# Patient Record
Sex: Female | Born: 1985 | Race: Black or African American | Hispanic: No | Marital: Single | Smoking: Never smoker
Health system: Southern US, Community
[De-identification: ages and names within clinical notes are randomized; demographics above are authoritative.]

## PROBLEM LIST (undated history)

## (undated) HISTORY — PX: APPENDECTOMY: SHX54

---

## 2018-05-25 ENCOUNTER — Other Ambulatory Visit: Payer: Self-pay

## 2018-05-25 ENCOUNTER — Emergency Department (HOSPITAL_COMMUNITY): Payer: Medicare (Managed Care)

## 2018-05-25 ENCOUNTER — Emergency Department (HOSPITAL_COMMUNITY)
Admission: EM | Admit: 2018-05-25 | Discharge: 2018-05-25 | Disposition: A | Discharge status: Home / Self Care | Payer: Medicare (Managed Care) | Attending: Emergency Medicine | Admitting: Emergency Medicine

## 2018-05-25 ENCOUNTER — Encounter (HOSPITAL_COMMUNITY): Payer: Self-pay | Admitting: Emergency Medicine

## 2018-05-25 DIAGNOSIS — R1013 Epigastric pain: Secondary | ICD-10-CM | POA: Diagnosis present

## 2018-05-25 DIAGNOSIS — K529 Noninfective gastroenteritis and colitis, unspecified: Secondary | ICD-10-CM | POA: Diagnosis not present

## 2018-05-25 LAB — CBC WITH DIFFERENTIAL/PLATELET
Abs Immature Granulocytes: 0.03 10*3/uL (ref 0.00–0.07)
Basophils Absolute: 0.1 10*3/uL (ref 0.0–0.1)
Basophils Relative: 1 %
Eosinophils Absolute: 0.1 10*3/uL (ref 0.0–0.5)
Eosinophils Relative: 1 %
HCT: 40.6 % (ref 36.0–46.0)
Hemoglobin: 13.6 g/dL (ref 12.0–15.0)
Immature Granulocytes: 0 %
Lymphocytes Relative: 22 %
Lymphs Abs: 2.3 10*3/uL (ref 0.7–4.0)
MCH: 32.8 pg (ref 26.0–34.0)
MCHC: 33.5 g/dL (ref 30.0–36.0)
MCV: 97.8 fL (ref 80.0–100.0)
Monocytes Absolute: 0.6 10*3/uL (ref 0.1–1.0)
Monocytes Relative: 6 %
Neutro Abs: 7.5 10*3/uL (ref 1.7–7.7)
Neutrophils Relative %: 70 %
Platelets: 322 10*3/uL (ref 150–400)
RBC: 4.15 MIL/uL (ref 3.87–5.11)
RDW: 12.4 % (ref 11.5–15.5)
WBC: 10.6 10*3/uL — ABNORMAL HIGH (ref 4.0–10.5)
nRBC: 0 % (ref 0.0–0.2)

## 2018-05-25 LAB — URINALYSIS, ROUTINE W REFLEX MICROSCOPIC
Bilirubin Urine: NEGATIVE
Glucose, UA: NEGATIVE mg/dL
Hgb urine dipstick: NEGATIVE
Ketones, ur: 20 mg/dL — AB
Nitrite: NEGATIVE
Protein, ur: NEGATIVE mg/dL
Specific Gravity, Urine: 1.023 (ref 1.005–1.030)
pH: 5 (ref 5.0–8.0)

## 2018-05-25 LAB — POC URINE PREG, ED: Preg Test, Ur: NEGATIVE

## 2018-05-25 LAB — COMPREHENSIVE METABOLIC PANEL
ALT: 18 U/L (ref 0–44)
AST: 20 U/L (ref 15–41)
Albumin: 4 g/dL (ref 3.5–5.0)
Alkaline Phosphatase: 63 U/L (ref 38–126)
Anion gap: 10 (ref 5–15)
BUN: 11 mg/dL (ref 6–20)
CO2: 21 mmol/L — ABNORMAL LOW (ref 22–32)
Calcium: 9 mg/dL (ref 8.9–10.3)
Chloride: 106 mmol/L (ref 98–111)
Creatinine, Ser: 0.98 mg/dL (ref 0.44–1.00)
GFR calc Af Amer: >60 mL/min (ref >60)
GFR calc non Af Amer: >60 mL/min (ref >60)
Glucose, Bld: 104 mg/dL — ABNORMAL HIGH (ref 70–99)
Potassium: 3.6 mmol/L (ref 3.5–5.1)
Sodium: 137 mmol/L (ref 135–145)
Total Bilirubin: 0.4 mg/dL (ref 0.3–1.2)
Total Protein: 7.2 g/dL (ref 6.5–8.1)

## 2018-05-25 LAB — LIPASE, BLOOD: Lipase: 21 U/L (ref 11–51)

## 2018-05-25 MED ORDER — IOHEXOL 300 MG/ML  SOLN
100.0000 mL | Freq: Once | INTRAMUSCULAR | Status: AC | PRN
  Administered 2018-05-25: 100 mL via INTRAVENOUS

## 2018-05-25 MED ORDER — ONDANSETRON HCL 4 MG/2ML IJ SOLN
4.0000 mg | Freq: Once | INTRAMUSCULAR | Status: AC
  Administered 2018-05-25: 4 mg via INTRAVENOUS
  Filled 2018-05-25: qty 2

## 2018-05-25 MED ORDER — ONDANSETRON 4 MG PO TBDP: 4.0000 mg | ORAL_TABLET | Freq: Three times a day (TID) | ORAL | 0 refills | Status: AC | PRN

## 2018-05-25 MED ORDER — MORPHINE SULFATE (PF) 4 MG/ML IV SOLN
4.0000 mg | Freq: Once | INTRAVENOUS | Status: AC
  Administered 2018-05-25: 11:00:00 4 mg via INTRAVENOUS
  Filled 2018-05-25: qty 1

## 2018-05-25 MED ORDER — ALUM & MAG HYDROXIDE-SIMETH 200-200-20 MG/5ML PO SUSP
15.0000 mL | Freq: Once | ORAL | Status: AC
  Administered 2018-05-25: 15 mL via ORAL
  Filled 2018-05-25: qty 30

## 2018-05-25 MED ORDER — FAMOTIDINE 20 MG PO TABS: 20.0000 mg | ORAL_TABLET | Freq: Two times a day (BID) | ORAL | 0 refills | Status: AC

## 2018-05-25 NOTE — ED Notes (Signed)
Patient transported to CT 

## 2018-05-25 NOTE — Discharge Instructions (Signed)
Your lab tests and CT scan are normal as discussed. I suspect you have a lingering viral stomach flu.  You may use the medicines prescribed if needed for additional symptom relief. Return here for any worsening symptoms, otherwise plan to follow up as needed with a primary doctor (see the referrals given).

## 2018-05-25 NOTE — ED Triage Notes (Signed)
PT c/o epigastric pain with n/v/d x1 week with no fever. Pt denies any urinary symptoms or vaginal complaints. PT states nausea with vomiting x1 in 24hrs and diarrhea x2 in last 24 hrs.
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
## 2018-06-17 ENCOUNTER — Encounter (HOSPITAL_COMMUNITY): Payer: Self-pay

## 2018-06-17 ENCOUNTER — Emergency Department (HOSPITAL_COMMUNITY)
Admission: EM | Admit: 2018-06-17 | Discharge: 2018-06-17 | Disposition: A | Discharge status: Home / Self Care | Payer: Medicare (Managed Care) | Attending: Emergency Medicine | Admitting: Emergency Medicine

## 2018-06-17 ENCOUNTER — Other Ambulatory Visit: Payer: Self-pay

## 2018-06-17 DIAGNOSIS — R51 Headache: Secondary | ICD-10-CM | POA: Diagnosis present

## 2018-06-17 DIAGNOSIS — R519 Headache, unspecified: Secondary | ICD-10-CM

## 2018-06-17 LAB — CBC WITH DIFFERENTIAL/PLATELET
Abs Immature Granulocytes: 0.05 10*3/uL (ref 0.00–0.07)
Basophils Absolute: 0.1 10*3/uL (ref 0.0–0.1)
Basophils Relative: 1 %
Eosinophils Absolute: 0.1 10*3/uL (ref 0.0–0.5)
Eosinophils Relative: 1 %
HCT: 41.5 % (ref 36.0–46.0)
Hemoglobin: 13.6 g/dL (ref 12.0–15.0)
Immature Granulocytes: 1 %
Lymphocytes Relative: 23 %
Lymphs Abs: 2.5 10*3/uL (ref 0.7–4.0)
MCH: 32.6 pg (ref 26.0–34.0)
MCHC: 32.8 g/dL (ref 30.0–36.0)
MCV: 99.5 fL (ref 80.0–100.0)
Monocytes Absolute: 0.6 10*3/uL (ref 0.1–1.0)
Monocytes Relative: 5 %
Neutro Abs: 7.6 10*3/uL (ref 1.7–7.7)
Neutrophils Relative %: 69 %
Platelets: 320 10*3/uL (ref 150–400)
RBC: 4.17 MIL/uL (ref 3.87–5.11)
RDW: 12.7 % (ref 11.5–15.5)
WBC: 10.8 10*3/uL — ABNORMAL HIGH (ref 4.0–10.5)
nRBC: 0 % (ref 0.0–0.2)

## 2018-06-17 LAB — URINALYSIS, ROUTINE W REFLEX MICROSCOPIC
Bilirubin Urine: NEGATIVE
Glucose, UA: NEGATIVE mg/dL
Hgb urine dipstick: NEGATIVE
Ketones, ur: 5 mg/dL — AB
Nitrite: NEGATIVE
Protein, ur: NEGATIVE mg/dL
Specific Gravity, Urine: 1.009 (ref 1.005–1.030)
pH: 6 (ref 5.0–8.0)

## 2018-06-17 LAB — COMPREHENSIVE METABOLIC PANEL
ALT: 15 U/L (ref 0–44)
AST: 20 U/L (ref 15–41)
Albumin: 3.9 g/dL (ref 3.5–5.0)
Alkaline Phosphatase: 63 U/L (ref 38–126)
Anion gap: 11 (ref 5–15)
BUN: 12 mg/dL (ref 6–20)
CO2: 20 mmol/L — ABNORMAL LOW (ref 22–32)
Calcium: 8.8 mg/dL — ABNORMAL LOW (ref 8.9–10.3)
Chloride: 109 mmol/L (ref 98–111)
Creatinine, Ser: 0.85 mg/dL (ref 0.44–1.00)
GFR calc Af Amer: >60 mL/min (ref >60)
GFR calc non Af Amer: >60 mL/min (ref >60)
Glucose, Bld: 95 mg/dL (ref 70–99)
Potassium: 3.6 mmol/L (ref 3.5–5.1)
Sodium: 140 mmol/L (ref 135–145)
Total Bilirubin: 0.7 mg/dL (ref 0.3–1.2)
Total Protein: 7.2 g/dL (ref 6.5–8.1)

## 2018-06-17 LAB — PREGNANCY, URINE: Preg Test, Ur: NEGATIVE

## 2018-06-17 MED ORDER — METOCLOPRAMIDE HCL 10 MG PO TABS
10.0000 mg | ORAL_TABLET | Freq: Once | ORAL | Status: AC
  Administered 2018-06-17: 12:00:00 10 mg via ORAL
  Filled 2018-06-17: qty 1

## 2018-06-17 MED ORDER — METOCLOPRAMIDE HCL 10 MG PO TABS: 10.0000 mg | ORAL_TABLET | ORAL | 0 refills | Status: AC | PRN

## 2018-06-17 MED ORDER — METOCLOPRAMIDE HCL 10 MG PO TABS: 10.0000 mg | ORAL_TABLET | Freq: Once | ORAL | Status: DC

## 2018-06-17 MED ORDER — DIPHENHYDRAMINE HCL 25 MG PO CAPS
25.0000 mg | ORAL_CAPSULE | Freq: Once | ORAL | Status: AC
  Administered 2018-06-17: 25 mg via ORAL
  Filled 2018-06-17: qty 1

## 2018-06-17 MED ORDER — DIPHENHYDRAMINE HCL 25 MG PO CAPS: 25.0000 mg | ORAL_CAPSULE | Freq: Once | ORAL | Status: DC

## 2018-06-17 NOTE — ED Notes (Signed)
Pt reports diarrhea has been for 5 days, vomiting started 2 days ago.  LBM was today.  Pt says has been checking her bp for the past 4 days and it has been elevated.  Denies history of htn.  Reports pain in thighs when she walks and headache.

## 2018-06-17 NOTE — ED Provider Notes (Signed)
[facility] EMERGENCY DEPARTMENT Provider Note   CSN: [id number] Arrival date & time: 06/17/18  [id number]    History   Chief Complaint Chief Complaint  Patient presents with  . Headache  . Emesis    HPI Kimberly Gonzales is a 33 y.o. female with no PMHx presenting to the ED with headache, nausea and diarrhea. She reports her headache started one week ago, located posteriorly and sharp in nature, described as someone taking a "jackhammer" to her head. She endorses feeling dizziness, lightheaded, nauseated,  and has noticed some changes in her vision. She started having diarrhea about 5 days ago, states she is having 2-3 loose BM a day. She describes her abdominal pain as squeezing in nature in the epigastric and periumbilical region. She also complains of bilateral leg pain that is achy in nature. She denies any recent sick contacts.     HPI  History reviewed. No pertinent past medical history.  There are no active problems to display for this patient.   Past Surgical History:  Procedure Laterality Date  . APPENDECTOMY       OB History    Gravida  0   Para  0   Term  0   Preterm  0   AB  0   Living  0     SAB  0   TAB  0   Ectopic  0   Multiple  0   Live Births  0            Home Medications    Prior to Admission medications   Medication Sig Start Date End Date Taking? Authorizing Provider  famotidine (PEPCID) 20 MG tablet Take 1 tablet (20 mg total) by mouth 2 (two) times daily. Patient not taking: Reported on 06/17/2018 05/25/18   Burgess AmorIdol, Julie, PA-C  metoCLOPramide (REGLAN) 10 MG tablet Take 1 tablet (10 mg total) by mouth as needed for nausea. 06/17/18   ,  N, DO  ondansetron (ZOFRAN ODT) 4 MG disintegrating tablet Take 1 tablet (4 mg total) by mouth every 8 (eight) hours as needed for nausea or vomiting. Patient not taking: Reported on 06/17/2018 05/25/18   Burgess AmorIdol, Julie, PA-C    Family History Family History  Problem Relation Age of Onset  .  Hypertension Mother   . Asthma Mother   . Asthma Father   . Hypertension Father     Social History Social History   Tobacco Use  . Smoking status: Never Smoker  . Smokeless tobacco: Never Used  Substance Use Topics  . Alcohol use: Never    Frequency: Never  . Drug use: Never     Allergies   Patient has no known allergies.   Review of Systems Review of Systems  Constitutional: Negative for chills, diaphoresis, fatigue and fever.  HENT: Negative for congestion, ear pain, hearing loss, rhinorrhea, sinus pressure, sinus pain, sore throat and tinnitus.   Eyes: Positive for visual disturbance. Negative for photophobia and pain.  Respiratory: Negative for apnea, cough, chest tightness and shortness of breath.   Cardiovascular: Negative for chest pain.  Gastrointestinal: Positive for abdominal pain, diarrhea and nausea. Negative for blood in stool, constipation and vomiting.  Genitourinary: Positive for frequency. Negative for difficulty urinating, dysuria, flank pain, hematuria and urgency.  Musculoskeletal: Positive for back pain and myalgias. Negative for neck pain and neck stiffness.  Neurological: Positive for dizziness, light-headedness and headaches. Negative for weakness and numbness.     Physical Exam Updated Vital Signs BP 106/73 (  BP Location: Left Arm)   Pulse 60   Resp 20   Ht 5\' 2"  (1.575 m)   Wt 111.1 kg   LMP 04/17/2018 (Approximate)   SpO2 99%   BMI 44.81 kg/m   Physical Exam Constitutional:      General: She is not in acute distress.    Appearance: She is well-developed. She is obese. She is not ill-appearing.  HENT:     Head: Normocephalic and atraumatic.  Eyes:     General: No visual field deficit.    Extraocular Movements: Extraocular movements intact.     Right eye: No nystagmus.     Left eye: No nystagmus.     Pupils: Pupils are equal, round, and reactive to light.  Neck:     Musculoskeletal: Normal range of motion and neck supple. No neck  rigidity.  Cardiovascular:     Rate and Rhythm: Normal rate and regular rhythm.     Heart sounds: No murmur. No friction rub. No gallop.   Pulmonary:     Effort: Pulmonary effort is normal. No respiratory distress.     Breath sounds: Normal breath sounds. No wheezing or rales.  Abdominal:     General: Bowel sounds are normal. There is no distension.     Palpations: Abdomen is soft.     Tenderness: There is abdominal tenderness. There is no guarding.  Musculoskeletal:        General: No swelling or tenderness.  Lymphadenopathy:     Cervical: No cervical adenopathy.  Skin:    General: Skin is warm and dry.  Neurological:     Mental Status: She is alert and oriented to person, place, and time.     Cranial Nerves: No cranial nerve deficit.     Sensory: No sensory deficit.     Motor: No weakness.  Psychiatric:        Mood and Affect: Mood normal. Mood is not anxious.        Speech: Speech normal.        Behavior: Behavior normal. Behavior is not agitated.      ED Treatments / Results  Labs (all labs ordered are listed, but only abnormal results are displayed) Labs Reviewed  CBC WITH DIFFERENTIAL/PLATELET - Abnormal; Notable for the following components:      Result Value   WBC 10.8 (*)    All other components within normal limits  COMPREHENSIVE METABOLIC PANEL - Abnormal; Notable for the following components:   CO2 20 (*)    Calcium 8.8 (*)    All other components within normal limits  URINALYSIS, ROUTINE W REFLEX MICROSCOPIC - Abnormal; Notable for the following components:   Ketones, ur 5 (*)    Leukocytes,Ua TRACE (*)    Bacteria, UA RARE (*)    All other components within normal limits  PREGNANCY, URINE    EKG EKG Interpretation  Date/Time:  Tuesday Jun 17 2018 09:39:12 EDT Ventricular Rate:  78 PR Interval:    QRS Duration: 89 QT Interval:  385 QTC Calculation: 442 R Axis:   85 Text Interpretation:  Sinus rhythm Ventricular premature complex Poor baseline  Low voltage, precordial leads Confirmed by Blane Ohara [phone]) on 06/17/2018 9:53:12 AM   Radiology No results found.  Procedures Procedures (including critical care time)  Medications Ordered in ED Medications  diphenhydrAMINE (BENADRYL) capsule 25 mg (25 mg Oral Given 06/17/18 1133)  metoCLOPramide (REGLAN) tablet 10 mg (10 mg Oral Given 06/17/18 1133)     Initial Impression /  Assessment and Plan / ED Course  I have reviewed the triage vital signs and the nursing notes.  Pertinent labs & imaging results that were available during my care of the patient were reviewed by me and considered in my medical decision making (see chart for details).  Pt is a 33 yo female presenting to the ED with a headache, abdominal pain, nausea and diarrhea. She endorses feeling dizziness, lightheaded, nauseated,  and has noticed some changes in her vision. She also complains of bilateral leg pain.   No focal deficits on exam. Cranial nerves intact. She had musculoskeletal tenderness of her cervical region but no neck stiffness.  Abdominal tenderness localized to periumbilical region. She was treated with a migraine cocktail and had some improvement. She is stable for discharge home, given a short supply of reglan and advised to take with benadryl but to avoid driving as this will make her drowsy. Advised to follow up with a PCP in one week.   Final Clinical Impressions(s) / ED Diagnoses   Final diagnoses:  Acute nonintractable headache, unspecified headache type    ED Discharge Orders         Ordered    metoCLOPramide (REGLAN) 10 MG tablet  As needed     06/17/18 1252           ,  N, DO 06/17/18 1316    Blane Ohara, MD 06/17/18 1528

## 2018-06-17 NOTE — Discharge Instructions (Addendum)
You came to the ED with a headache, nausea and diarrhea. You were treated with a migraine cocktail with benadryl and reglan. I have sent you a short supply of reglan to take with benadryl when you start getting these headaches. Avoid driving as this will make you drowsy. I want you to make sure to follow up with your PCP in one week. If you don't have one call [phone] to be referred to one.

## 2018-06-17 NOTE — ED Triage Notes (Signed)
Pt reports bp has been elevated for the past 4 days.  Also reports dizziness, h/a, n/v/d, and pain in her thighs.

## 2018-11-21 ENCOUNTER — Emergency Department (HOSPITAL_COMMUNITY)
Admission: EM | Admit: 2018-11-21 | Discharge: 2018-11-21 | Disposition: A | Discharge status: Home / Self Care | Payer: Medicare Other | Attending: Emergency Medicine | Admitting: Emergency Medicine

## 2018-11-21 ENCOUNTER — Encounter (HOSPITAL_COMMUNITY): Payer: Self-pay | Admitting: Emergency Medicine

## 2018-11-21 ENCOUNTER — Other Ambulatory Visit: Payer: Self-pay

## 2018-11-21 DIAGNOSIS — B372 Candidiasis of skin and nail: Secondary | ICD-10-CM

## 2018-11-21 DIAGNOSIS — R21 Rash and other nonspecific skin eruption: Secondary | ICD-10-CM | POA: Diagnosis present

## 2018-11-21 MED ORDER — NYSTATIN 100000 UNIT/GM EX CREA: 1.0000 "application " | TOPICAL_CREAM | Freq: Two times a day (BID) | CUTANEOUS | 0 refills | Status: AC

## 2018-11-21 NOTE — ED Provider Notes (Signed)
[facility] EMERGENCY DEPARTMENT Provider Note   CSN: [id number] Arrival date & time: 11/21/18  1651     History   Chief Complaint Chief Complaint  Patient presents with  . Rash    HPI Kimberly Gonzales is a 33 y.o. female with no significant past medical history presenting with  Bilateral rash inferior to her breasts which started more than a week ago, described as burning and itching in quality along with clear drainage and persistent moisture at the sites. She denies fevers, chills, no other skin concerns.  Denies prior history of similar symptoms, no new topicals, soaps, etc.  No recent abx use.  Pt is not diabetic.Marland Kitchen She has had no treatment prior to arrival.     The history is provided by the patient.    History reviewed. No pertinent past medical history.  There are no active problems to display for this patient.   Past Surgical History:  Procedure Laterality Date  . APPENDECTOMY       OB History    Gravida  0   Para  0   Term  0   Preterm  0   AB  0   Living  0     SAB  0   TAB  0   Ectopic  0   Multiple  0   Live Births  0            Home Medications    Prior to Admission medications   Medication Sig Start Date End Date Taking? Authorizing Provider  famotidine (PEPCID) 20 MG tablet Take 1 tablet (20 mg total) by mouth 2 (two) times daily. Patient not taking: Reported on 06/17/2018 05/25/18   Burgess Amor, PA-C  metoCLOPramide (REGLAN) 10 MG tablet Take 1 tablet (10 mg total) by mouth as needed for nausea. 06/17/18   Rehman, Areeg N, DO  nystatin cream (MYCOSTATIN) Apply 1 application topically 2 (two) times daily. Apply until rash is resolved, up to 2 weeks. 11/21/18   Burgess Amor, PA-C  ondansetron (ZOFRAN ODT) 4 MG disintegrating tablet Take 1 tablet (4 mg total) by mouth every 8 (eight) hours as needed for nausea or vomiting. Patient not taking: Reported on 06/17/2018 05/25/18   Burgess Amor, PA-C    Family History Family History  Problem  Relation Age of Onset  . Hypertension Mother   . Asthma Mother   . Asthma Father   . Hypertension Father     Social History Social History   Tobacco Use  . Smoking status: Never Smoker  . Smokeless tobacco: Never Used  Substance Use Topics  . Alcohol use: Never    Frequency: Never  . Drug use: Never     Allergies   Patient has no known allergies.   Review of Systems Review of Systems  Constitutional: Negative for chills and fever.  HENT: Negative.   Respiratory: Negative.   Cardiovascular: Negative.   Skin: Positive for rash.  Neurological: Negative for numbness.     Physical Exam Updated Vital Signs BP 123/74 (BP Location: Right Arm)   Pulse 74   Temp 98.8 F (37.1 C) (Oral)   Resp 15   Ht 5\' 2"  (1.575 m)   Wt 111.1 kg   SpO2 100%   BMI 44.81 kg/m   Physical Exam Constitutional:      General: She is not in acute distress.    Appearance: She is well-developed.  HENT:     Head: Normocephalic.  Neck:  Musculoskeletal: Neck supple.  Cardiovascular:     Rate and Rhythm: Normal rate.  Pulmonary:     Effort: Pulmonary effort is normal.     Breath sounds: No wheezing.  Musculoskeletal: Normal range of motion.  Skin:    Findings: Rash present. Rash is macular and scaling. Rash is not crusting, pustular or vesicular.     Comments: Bilateral macular moist, erythematous skin in the intertriginous region beneath both breasts which are pendulous.  No purulent drainage. Clearly demarcated borders which are dry, slightly scaly appearance, no satellite lesions.      ED Treatments / Results  Labs (all labs ordered are listed, but only abnormal results are displayed) Labs Reviewed - No data to display  EKG None  Radiology No results found.  Procedures Procedures (including critical care time)  Medications Ordered in ED Medications - No data to display   Initial Impression / Assessment and Plan / ED Course  I have reviewed the triage vital signs  and the nursing notes.  Pertinent labs & imaging results that were available during my care of the patient were reviewed by me and considered in my medical decision making (see chart for details).        Suspected candidal yeast/intertrigo. Doubt bacterial infection.  Skin otherwise clear.  Discussed cream vs powder tx, pt prefers cream.  Discussed keeping site as dry as possible, daily wash, dry completely after before applying medication. Change towels daily.  Prn f/u anticipated.referrals for establishing pcp given, return precautions outlined.  Final Clinical Impressions(s) / ED Diagnoses   Final diagnoses:  Candidal intertrigo    ED Discharge Orders         Ordered    nystatin cream (MYCOSTATIN)  2 times daily     11/21/18 2119           Evalee Jefferson, PA-C 11/22/18 1234    Milton Ferguson, MD 11/23/18 2018

## 2018-11-21 NOTE — ED Triage Notes (Signed)
Pt states that she has a rash under her breasts it burns and has a clear drainage

## 2019-08-27 ENCOUNTER — Encounter (HOSPITAL_COMMUNITY): Payer: Self-pay

## 2019-08-27 ENCOUNTER — Emergency Department (HOSPITAL_COMMUNITY)
Admission: EM | Admit: 2019-08-27 | Discharge: 2019-08-27 | Disposition: A | Discharge status: Home / Self Care | Payer: Medicare HMO | Attending: Emergency Medicine | Admitting: Emergency Medicine

## 2019-08-27 ENCOUNTER — Other Ambulatory Visit: Payer: Self-pay

## 2019-08-27 ENCOUNTER — Emergency Department (HOSPITAL_COMMUNITY): Payer: Medicare HMO

## 2019-08-27 DIAGNOSIS — M25511 Pain in right shoulder: Secondary | ICD-10-CM | POA: Diagnosis not present

## 2019-08-27 DIAGNOSIS — Y939 Activity, unspecified: Secondary | ICD-10-CM | POA: Insufficient documentation

## 2019-08-27 DIAGNOSIS — Y999 Unspecified external cause status: Secondary | ICD-10-CM | POA: Diagnosis not present

## 2019-08-27 DIAGNOSIS — T1490XA Injury, unspecified, initial encounter: Secondary | ICD-10-CM

## 2019-08-27 DIAGNOSIS — S20212A Contusion of left front wall of thorax, initial encounter: Secondary | ICD-10-CM | POA: Diagnosis not present

## 2019-08-27 DIAGNOSIS — M799 Soft tissue disorder, unspecified: Secondary | ICD-10-CM | POA: Insufficient documentation

## 2019-08-27 DIAGNOSIS — S81011A Laceration without foreign body, right knee, initial encounter: Secondary | ICD-10-CM | POA: Diagnosis not present

## 2019-08-27 DIAGNOSIS — S301XXA Contusion of abdominal wall, initial encounter: Secondary | ICD-10-CM | POA: Insufficient documentation

## 2019-08-27 DIAGNOSIS — Y929 Unspecified place or not applicable: Secondary | ICD-10-CM | POA: Insufficient documentation

## 2019-08-27 DIAGNOSIS — Z23 Encounter for immunization: Secondary | ICD-10-CM | POA: Insufficient documentation

## 2019-08-27 DIAGNOSIS — S29001A Unspecified injury of muscle and tendon of front wall of thorax, initial encounter: Secondary | ICD-10-CM | POA: Diagnosis present

## 2019-08-27 DIAGNOSIS — T148XXA Other injury of unspecified body region, initial encounter: Secondary | ICD-10-CM

## 2019-08-27 LAB — COMPREHENSIVE METABOLIC PANEL
ALT: 18 U/L (ref 0–44)
AST: 40 U/L (ref 15–41)
Albumin: 3.5 g/dL (ref 3.5–5.0)
Alkaline Phosphatase: 67 U/L (ref 38–126)
Anion gap: 11 (ref 5–15)
BUN: 15 mg/dL (ref 6–20)
CO2: 21 mmol/L — ABNORMAL LOW (ref 22–32)
Calcium: 8.6 mg/dL — ABNORMAL LOW (ref 8.9–10.3)
Chloride: 106 mmol/L (ref 98–111)
Creatinine, Ser: 0.92 mg/dL (ref 0.44–1.00)
GFR calc Af Amer: >60 mL/min (ref >60)
GFR calc non Af Amer: >60 mL/min (ref >60)
Glucose, Bld: 101 mg/dL — ABNORMAL HIGH (ref 70–99)
Potassium: 4.2 mmol/L (ref 3.5–5.1)
Sodium: 138 mmol/L (ref 135–145)
Total Bilirubin: 1.2 mg/dL (ref 0.3–1.2)
Total Protein: 6.7 g/dL (ref 6.5–8.1)

## 2019-08-27 LAB — URINALYSIS, ROUTINE W REFLEX MICROSCOPIC
Bilirubin Urine: NEGATIVE
Glucose, UA: NEGATIVE mg/dL
Ketones, ur: NEGATIVE mg/dL
Nitrite: NEGATIVE
Protein, ur: 30 mg/dL — AB
Specific Gravity, Urine: 1.023 (ref 1.005–1.030)
pH: 5 (ref 5.0–8.0)

## 2019-08-27 LAB — CBC
HCT: 47.3 % — ABNORMAL HIGH (ref 36.0–46.0)
Hemoglobin: 14.5 g/dL (ref 12.0–15.0)
MCH: 31.5 pg (ref 26.0–34.0)
MCHC: 30.7 g/dL (ref 30.0–36.0)
MCV: 102.8 fL — ABNORMAL HIGH (ref 80.0–100.0)
Platelets: 305 10*3/uL (ref 150–400)
RBC: 4.6 MIL/uL (ref 3.87–5.11)
RDW: 13 % (ref 11.5–15.5)
WBC: 24.1 10*3/uL — ABNORMAL HIGH (ref 4.0–10.5)
nRBC: 0 % (ref 0.0–0.2)

## 2019-08-27 LAB — I-STAT CHEM 8, ED
BUN: 18 mg/dL (ref 6–20)
Calcium, Ion: 1.06 mmol/L — ABNORMAL LOW (ref 1.15–1.40)
Chloride: 106 mmol/L (ref 98–111)
Creatinine, Ser: 0.9 mg/dL (ref 0.44–1.00)
Glucose, Bld: 110 mg/dL — ABNORMAL HIGH (ref 70–99)
HCT: 46 % (ref 36.0–46.0)
Hemoglobin: 15.6 g/dL — ABNORMAL HIGH (ref 12.0–15.0)
Potassium: 4.5 mmol/L (ref 3.5–5.1)
Sodium: 140 mmol/L (ref 135–145)
TCO2: 24 mmol/L (ref 22–32)

## 2019-08-27 LAB — I-STAT BETA HCG BLOOD, ED (MC, WL, AP ONLY): I-stat hCG, quantitative: 8.1 m[IU]/mL — ABNORMAL HIGH (ref <5)

## 2019-08-27 LAB — LACTIC ACID, PLASMA: Lactic Acid, Venous: 1.9 mmol/L (ref 0.5–1.9)

## 2019-08-27 LAB — PROTIME-INR
INR: 1 (ref 0.8–1.2)
Prothrombin Time: 12.9 seconds (ref 11.4–15.2)

## 2019-08-27 LAB — SAMPLE TO BLOOD BANK

## 2019-08-27 LAB — HCG, QUANTITATIVE, PREGNANCY: hCG, Beta Chain, Quant, S: <1 m[IU]/mL (ref <5)

## 2019-08-27 MED ORDER — IOHEXOL 300 MG/ML  SOLN
100.0000 mL | Freq: Once | INTRAMUSCULAR | Status: AC | PRN
  Administered 2019-08-27: 100 mL via INTRAVENOUS

## 2019-08-27 MED ORDER — SODIUM CHLORIDE 0.9 % IV BOLUS
500.0000 mL | Freq: Once | INTRAVENOUS | Status: AC
  Administered 2019-08-27: 500 mL via INTRAVENOUS

## 2019-08-27 MED ORDER — HYDROMORPHONE HCL 1 MG/ML IJ SOLN
0.5000 mg | Freq: Once | INTRAMUSCULAR | Status: AC
  Administered 2019-08-27: 0.5 mg via INTRAVENOUS
  Filled 2019-08-27: qty 1

## 2019-08-27 MED ORDER — CYCLOBENZAPRINE HCL 10 MG PO TABS: 5.0000 mg | ORAL_TABLET | Freq: Two times a day (BID) | ORAL | 0 refills | Status: AC | PRN

## 2019-08-27 MED ORDER — CELECOXIB 200 MG PO CAPS
200.0000 mg | ORAL_CAPSULE | Freq: Two times a day (BID) | ORAL | 0 refills | Status: AC
Start: 2019-08-27 — End: ?

## 2019-08-27 MED ORDER — TRAMADOL HCL 50 MG PO TABS: 50.0000 mg | ORAL_TABLET | Freq: Four times a day (QID) | ORAL | 0 refills | Status: AC | PRN

## 2019-08-27 MED ORDER — ONDANSETRON HCL 4 MG/2ML IJ SOLN
4.0000 mg | Freq: Once | INTRAMUSCULAR | Status: AC
  Administered 2019-08-27: 4 mg via INTRAVENOUS
  Filled 2019-08-27: qty 2

## 2019-08-27 MED ORDER — CYCLOBENZAPRINE HCL 10 MG PO TABS: 5.0000 mg | ORAL_TABLET | Freq: Two times a day (BID) | ORAL | 0 refills | Status: DC | PRN

## 2019-08-27 MED ORDER — FENTANYL CITRATE (PF) 100 MCG/2ML IJ SOLN
50.0000 ug | Freq: Once | INTRAMUSCULAR | Status: AC
  Administered 2019-08-27: 50 ug via INTRAVENOUS
  Filled 2019-08-27: qty 2

## 2019-08-27 MED ORDER — TETANUS-DIPHTH-ACELL PERTUSSIS 5-2.5-18.5 LF-MCG/0.5 IM SUSP
0.5000 mL | Freq: Once | INTRAMUSCULAR | Status: AC
  Administered 2019-08-27: 0.5 mL via INTRAMUSCULAR
  Filled 2019-08-27: qty 0.5

## 2019-08-27 MED ORDER — SODIUM CHLORIDE 0.9 % IV BOLUS
1000.0000 mL | Freq: Once | INTRAVENOUS | Status: AC
  Administered 2019-08-27: 1000 mL via INTRAVENOUS
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
## 2019-08-27 NOTE — ED Notes (Signed)
During assessment . Pt visibly SOB and states she is having a hard time breathing . Oxygen applied. PA and MD at bedside.

## 2019-08-27 NOTE — Discharge Instructions (Signed)
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
## 2019-08-27 NOTE — ED Notes (Signed)
Second gold and light green top sent.
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
## 2020-12-06 IMAGING — CT CT ABDOMEN AND PELVIS WITH CONTRAST
2 of 4 series · 17 of 46 positions shown, 19 images · IV contrast (Isovue)
Comparison: No priors.

CLINICAL DATA: 32-year-old female with history of epigastric pain
with nausea, vomiting and diarrhea for 1 week. No fever.

EXAM:
CT ABDOMEN AND PELVIS WITH CONTRAST
TECHNIQUE: Multidetector CT imaging of the abdomen and pelvis was performed
using the standard protocol following bolus administration of
intravenous contrast.
CONTRAST:  100mL OMNIPAQUE IOHEXOL 300 MG/ML  SOLN

[Series 2: axial st · axial · 0.74mm/px · z∈[-314,+36]mm · 14 of 78 slices shown, 16 images]
[im 4/78  soft-tissue]
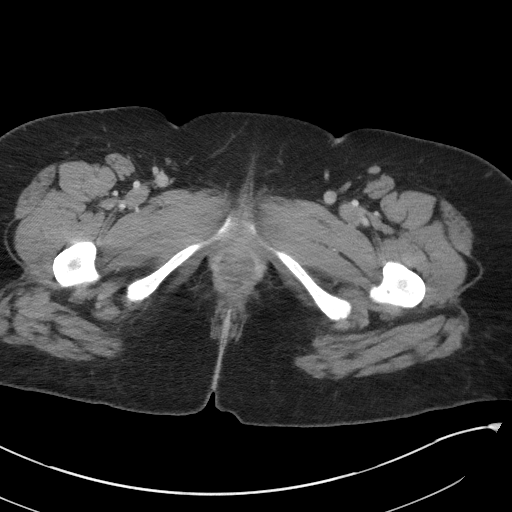
[im 4/78  bone]
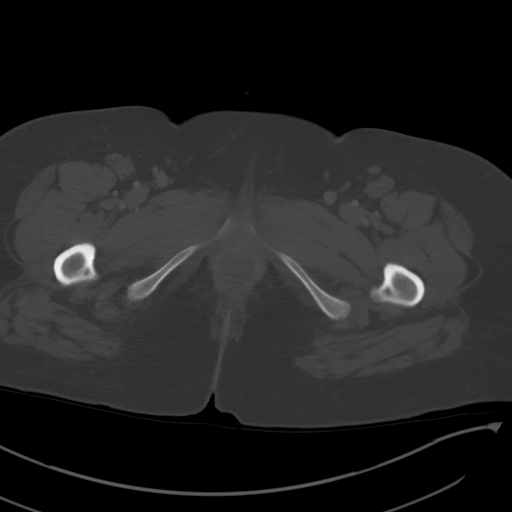
[im 12/78  soft-tissue]
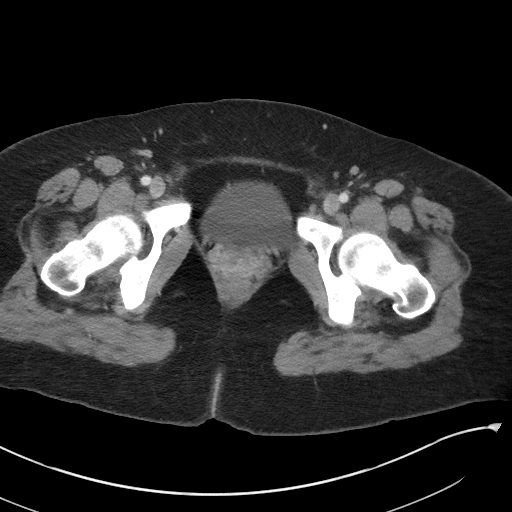
[im 16/78  soft-tissue]
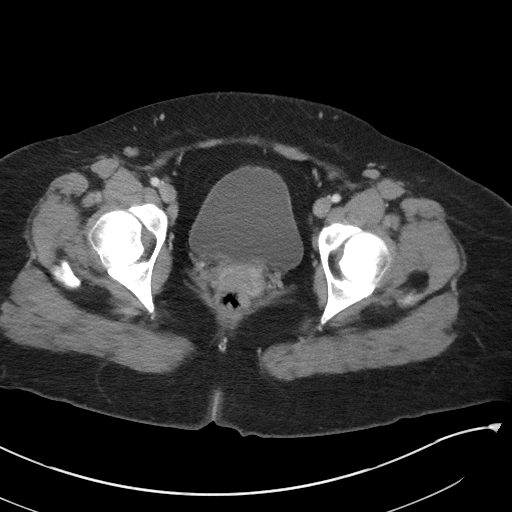
[im 20/78  soft-tissue]
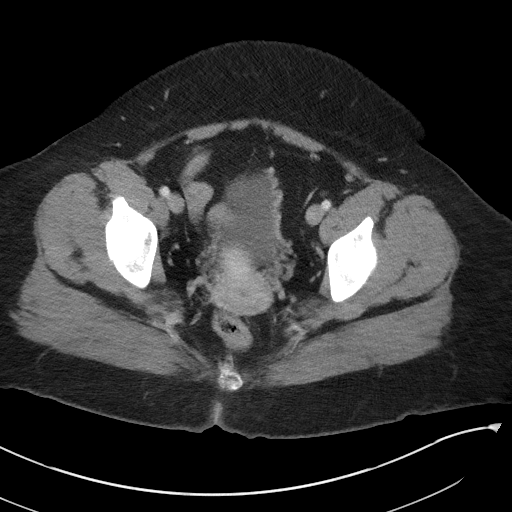
[im 27/78  soft-tissue]
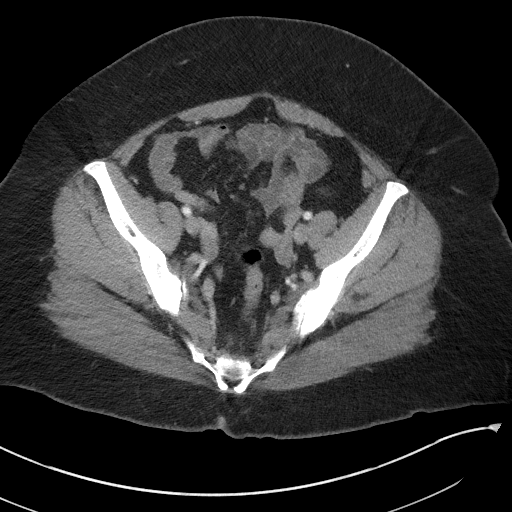
[im 31/78  soft-tissue]
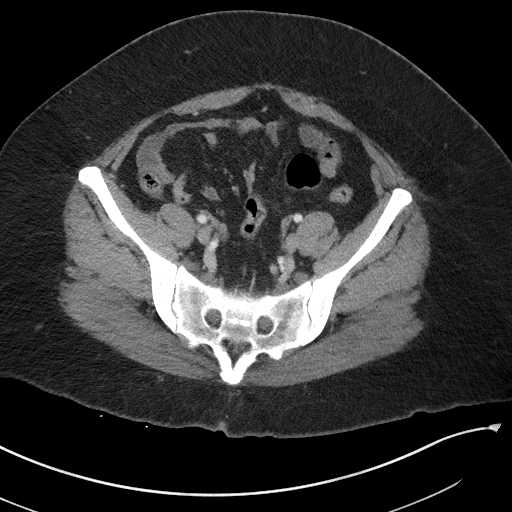
[im 35/78  soft-tissue]
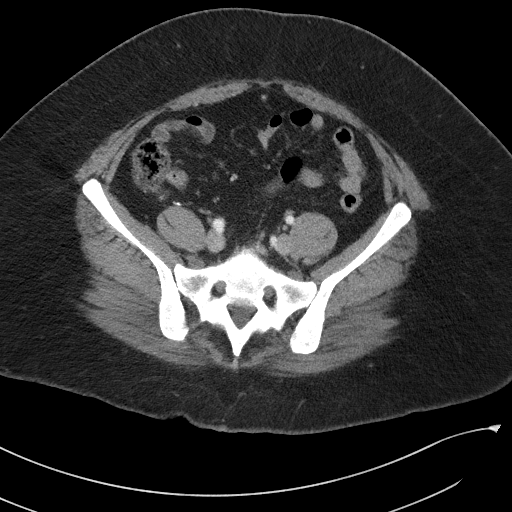
[im 43/78  soft-tissue]
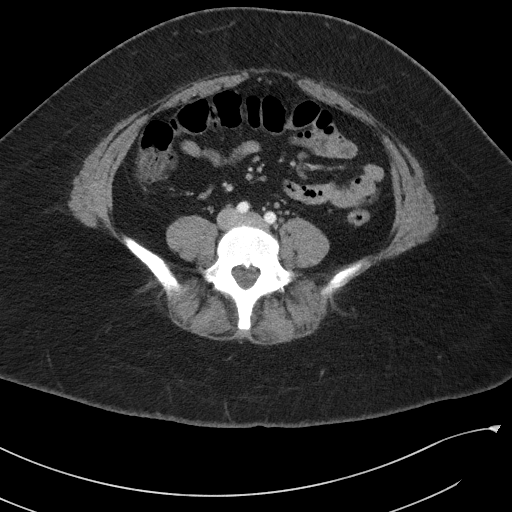
[im 47/78  soft-tissue]
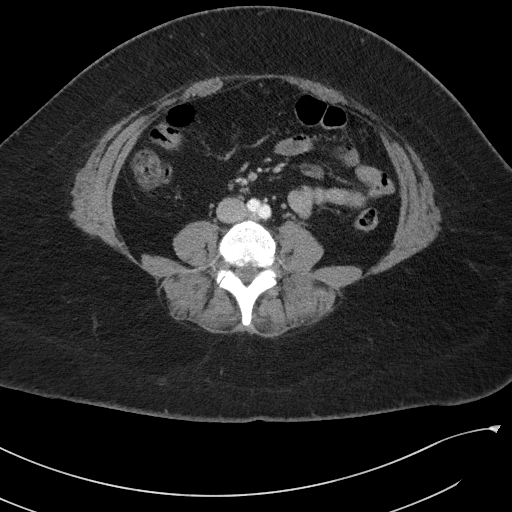
[im 47/78  bone]
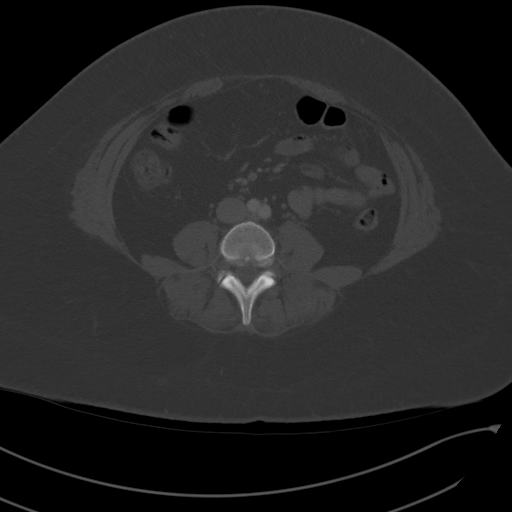
[im 51/78  soft-tissue]
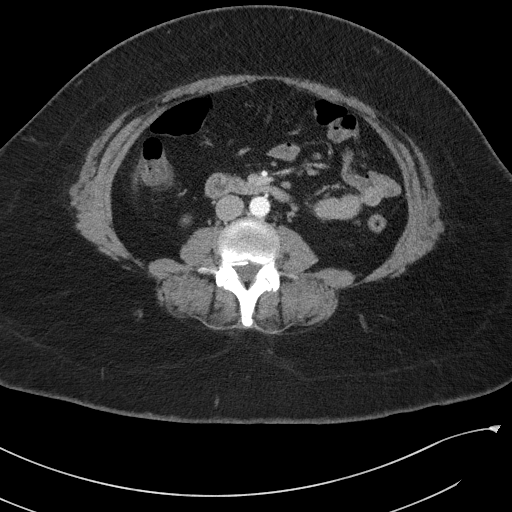
[im 58/78  soft-tissue]
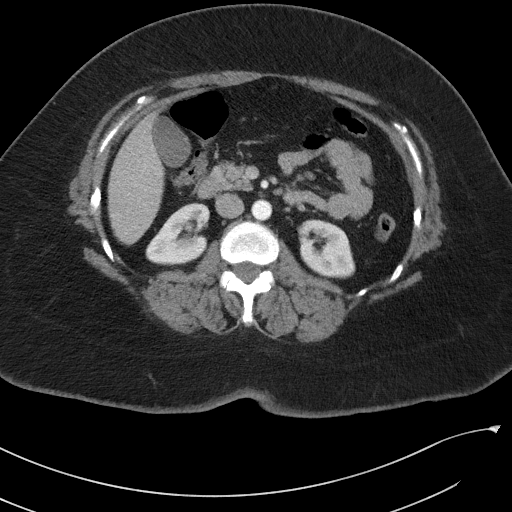
[im 62/78  soft-tissue]
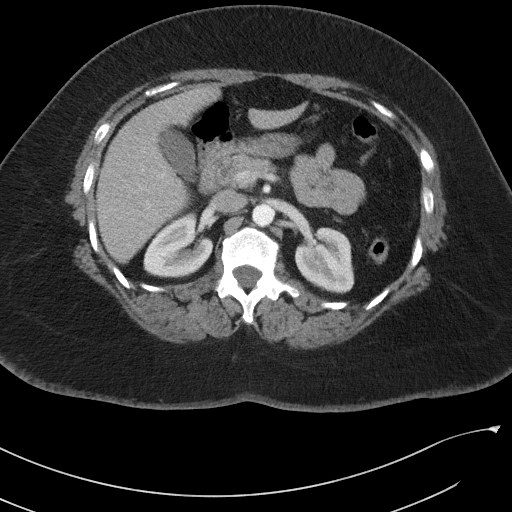
[im 66/78  soft-tissue]
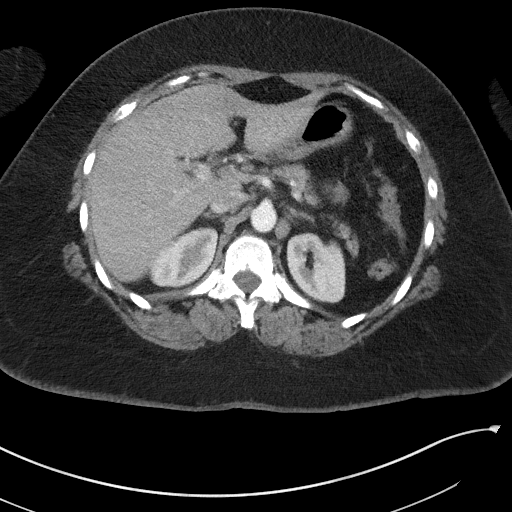
[im 74/78  soft-tissue]
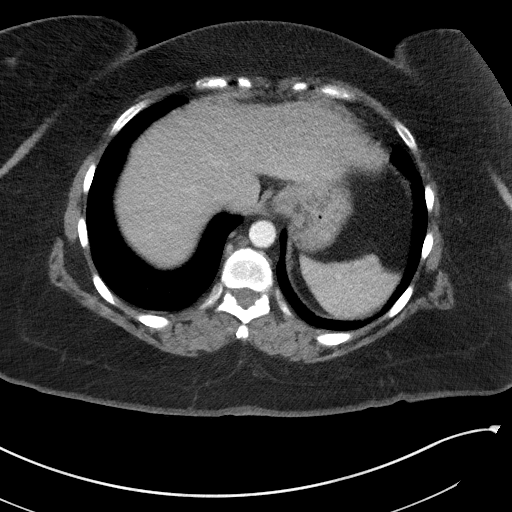

[Series 5: coronal st · coronal · 0.77mm/px · 3 of 104 slices shown]
[im 35/104  soft-tissue]
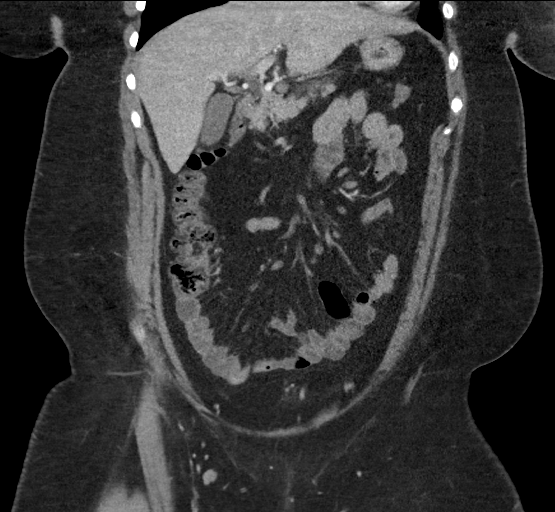
[im 46/104  soft-tissue]
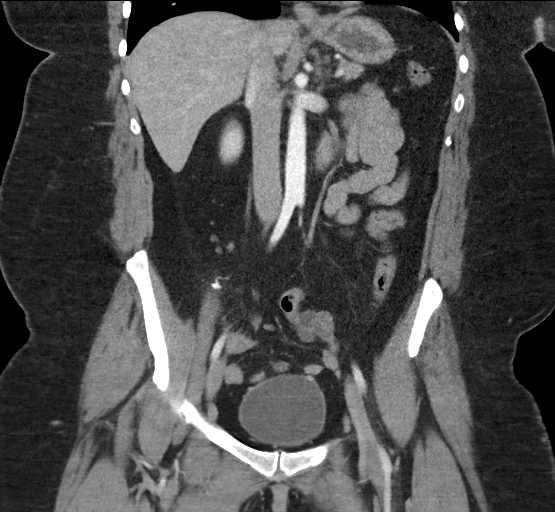
[im 58/104  soft-tissue]
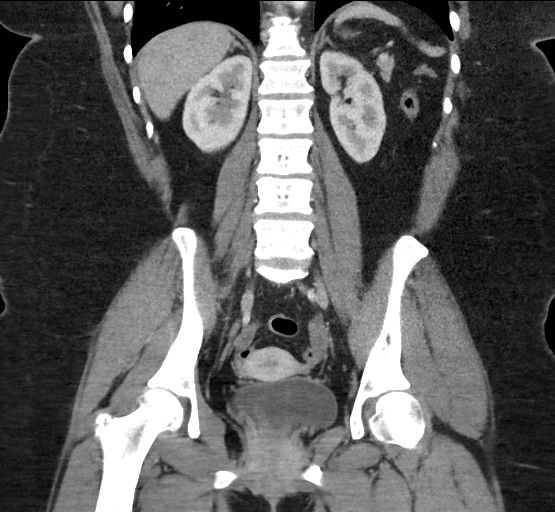

[17 of 46 positions shown; findings below may reference images not displayed]

FINDINGS: Lower chest: Unremarkable.

Hepatobiliary: No suspicious cystic or solid hepatic lesions. No
intra or extrahepatic biliary ductal dilatation. Gallbladder is
normal in appearance.

Pancreas: No pancreatic mass. No pancreatic ductal dilatation. No
pancreatic or peripancreatic fluid or inflammatory changes.

Spleen: Unremarkable.

Adrenals/Urinary Tract: Bilateral kidneys and adrenal glands are
normal in appearance. No hydroureteronephrosis. Urinary bladder is
normal in appearance.

Stomach/Bowel: Normal appearance of the stomach. No pathologic
dilatation of small bowel or colon. Status post appendectomy.

Vascular/Lymphatic: No atherosclerotic calcifications in the
abdominal aorta or pelvic vasculature. No lymphadenopathy noted in
the abdomen or pelvis.

Reproductive: Uterus and ovaries are unremarkable in appearance.

Other: No significant volume of ascites.  No pneumoperitoneum.

Musculoskeletal: There are no aggressive appearing lytic or blastic
lesions noted in the visualized portions of the skeleton.
IMPRESSION: 1. No acute findings are noted in the abdomen or pelvis to account
for the patient's symptoms.
2. Status post appendectomy

## 2021-12-06 ENCOUNTER — Emergency Department (HOSPITAL_COMMUNITY): Payer: Medicare HMO

## 2021-12-06 ENCOUNTER — Encounter (HOSPITAL_COMMUNITY): Payer: Self-pay

## 2021-12-06 ENCOUNTER — Other Ambulatory Visit: Payer: Self-pay

## 2021-12-06 ENCOUNTER — Emergency Department (HOSPITAL_COMMUNITY)
Admission: EM | Admit: 2021-12-06 | Discharge: 2021-12-06 | Disposition: A | Discharge status: Home / Self Care | Payer: Medicare HMO | Attending: Student | Admitting: Student

## 2021-12-06 DIAGNOSIS — N3000 Acute cystitis without hematuria: Secondary | ICD-10-CM | POA: Diagnosis not present

## 2021-12-06 DIAGNOSIS — R1010 Upper abdominal pain, unspecified: Secondary | ICD-10-CM | POA: Diagnosis present

## 2021-12-06 LAB — COMPREHENSIVE METABOLIC PANEL
ALT: 17 U/L (ref 0–44)
AST: 23 U/L (ref 15–41)
Albumin: 4.1 g/dL (ref 3.5–5.0)
Alkaline Phosphatase: 60 U/L (ref 38–126)
Anion gap: 10 (ref 5–15)
BUN: 14 mg/dL (ref 6–20)
CO2: 19 mmol/L — ABNORMAL LOW (ref 22–32)
Calcium: 9 mg/dL (ref 8.9–10.3)
Chloride: 111 mmol/L (ref 98–111)
Creatinine, Ser: 1.01 mg/dL — ABNORMAL HIGH (ref 0.44–1.00)
GFR, Estimated: >60 mL/min (ref >60)
Glucose, Bld: 115 mg/dL — ABNORMAL HIGH (ref 70–99)
Potassium: 3.4 mmol/L — ABNORMAL LOW (ref 3.5–5.1)
Sodium: 140 mmol/L (ref 135–145)
Total Bilirubin: 0.7 mg/dL (ref 0.3–1.2)
Total Protein: 7.5 g/dL (ref 6.5–8.1)

## 2021-12-06 LAB — CBC
HCT: 40.6 % (ref 36.0–46.0)
Hemoglobin: 13.9 g/dL (ref 12.0–15.0)
MCH: 32.3 pg (ref 26.0–34.0)
MCHC: 34.2 g/dL (ref 30.0–36.0)
MCV: 94.2 fL (ref 80.0–100.0)
Platelets: 333 10*3/uL (ref 150–400)
RBC: 4.31 MIL/uL (ref 3.87–5.11)
RDW: 13.2 % (ref 11.5–15.5)
WBC: 11.6 10*3/uL — ABNORMAL HIGH (ref 4.0–10.5)
nRBC: 0 % (ref 0.0–0.2)

## 2021-12-06 LAB — URINALYSIS, ROUTINE W REFLEX MICROSCOPIC
Bilirubin Urine: NEGATIVE
Glucose, UA: 50 mg/dL — AB
Ketones, ur: 80 mg/dL — AB
Nitrite: NEGATIVE
Protein, ur: 100 mg/dL — AB
Specific Gravity, Urine: 1.029 (ref 1.005–1.030)
WBC, UA: >50 WBC/hpf — ABNORMAL HIGH (ref 0–5)
pH: 5 (ref 5.0–8.0)

## 2021-12-06 LAB — PREGNANCY, URINE: Preg Test, Ur: NEGATIVE

## 2021-12-06 LAB — LIPASE, BLOOD: Lipase: 26 U/L (ref 11–51)

## 2021-12-06 MED ORDER — IOHEXOL 300 MG/ML  SOLN
100.0000 mL | Freq: Once | INTRAMUSCULAR | Status: AC | PRN
  Administered 2021-12-06: 100 mL via INTRAVENOUS

## 2021-12-06 MED ORDER — HYDROMORPHONE HCL 1 MG/ML IJ SOLN
1.0000 mg | Freq: Once | INTRAMUSCULAR | Status: AC
  Administered 2021-12-06: 1 mg via INTRAVENOUS
  Filled 2021-12-06: qty 1

## 2021-12-06 MED ORDER — ONDANSETRON HCL 4 MG PO TABS: 4.0000 mg | ORAL_TABLET | Freq: Four times a day (QID) | ORAL | 0 refills | Status: DC

## 2021-12-06 MED ORDER — CEPHALEXIN 500 MG PO CAPS: 500.0000 mg | ORAL_CAPSULE | Freq: Four times a day (QID) | ORAL | 0 refills | Status: AC

## 2021-12-06 MED ORDER — SODIUM CHLORIDE 0.9 % IV BOLUS
1000.0000 mL | Freq: Once | INTRAVENOUS | Status: AC
  Administered 2021-12-06: 1000 mL via INTRAVENOUS

## 2021-12-06 MED ORDER — PHENAZOPYRIDINE HCL 200 MG PO TABS: 200.0000 mg | ORAL_TABLET | Freq: Three times a day (TID) | ORAL | 0 refills | Status: AC

## 2021-12-06 MED ORDER — ONDANSETRON HCL 4 MG/2ML IJ SOLN
4.0000 mg | Freq: Once | INTRAMUSCULAR | Status: AC
  Administered 2021-12-06: 4 mg via INTRAVENOUS
  Filled 2021-12-06: qty 2

## 2021-12-06 NOTE — ED Notes (Signed)
LUQ pain x 2-3 weeks. Diarrhea x 3 in last 24 hours. No recent abx. Pain better with standing. Pain worse with sitting. Nad. Denies gu symptoms. Color wnl. Mm moist

## 2021-12-06 NOTE — ED Provider Notes (Signed)
[facility] EMERGENCY DEPARTMENT Provider Note   CSN: [id number] Arrival date & time: 12/06/21  1416     History  No chief complaint on file.   Kimberly Gonzales is a 36 y.o. female.  HPI   Patient presents due to upper abdominal tenderness x3 weeks.  History of appendectomy no other abdominal surgeries or comorbidities.  Patient states she has been having upper abdominal pain constantly, is worse after she eats.  Associated with nausea initially vomiting but now dry heaving.  She is having intermittent episodes of diarrhea over the past few weeks none today.  The pain itself radiates to her back.  Denies any alcohol use, she does take anti-inflammatories occasionally but has not had any bleeding in her stool.    Home Medications Prior to Admission medications   Medication Sig Start Date End Date Taking? Authorizing Provider  cephALEXin (KEFLEX) 500 MG capsule Take 1 capsule (500 mg total) by mouth 4 (four) times daily. 12/06/21  Yes Theron Arista, PA-C  ondansetron (ZOFRAN) 4 MG tablet Take 1 tablet (4 mg total) by mouth every 6 (six) hours. 12/06/21  Yes Theron Arista, PA-C  phenazopyridine (PYRIDIUM) 200 MG tablet Take 1 tablet (200 mg total) by mouth 3 (three) times daily. 12/06/21  Yes Theron Arista, PA-C  celecoxib (CELEBREX) 200 MG capsule Take 1 capsule (200 mg total) by mouth 2 (two) times daily. 08/27/19   Arthor Captain, PA-C  cyclobenzaprine (FLEXERIL) 10 MG tablet Take 0.5-1 tablets (5-10 mg total) by mouth 2 (two) times daily as needed for muscle spasms. 08/27/19   Arthor Captain, PA-C  famotidine (PEPCID) 20 MG tablet Take 1 tablet (20 mg total) by mouth 2 (two) times daily. Patient not taking: Reported on 06/17/2018 05/25/18   Burgess Amor, PA-C  metoCLOPramide (REGLAN) 10 MG tablet Take 1 tablet (10 mg total) by mouth as needed for nausea. Patient not taking: Reported on 08/27/2019 06/17/18   Rehman, Areeg N, DO  nystatin cream (MYCOSTATIN) Apply 1 application topically 2 (two) times  daily. Apply until rash is resolved, up to 2 weeks. Patient not taking: Reported on 08/27/2019 11/21/18   Burgess Amor, PA-C  ondansetron (ZOFRAN ODT) 4 MG disintegrating tablet Take 1 tablet (4 mg total) by mouth every 8 (eight) hours as needed for nausea or vomiting. Patient not taking: Reported on 06/17/2018 05/25/18   Burgess Amor, PA-C  traMADol (ULTRAM) 50 MG tablet Take 1 tablet (50 mg total) by mouth every 6 (six) hours as needed. 08/27/19   Arthor Captain, PA-C      Allergies    Eggs or egg-derived products    Review of Systems   Review of Systems  Physical Exam Updated Vital Signs BP (!) 143/78   Pulse 71   Temp 98.3 F (36.8 C) (Oral)   Resp 20   Ht 5\' 2"  (1.575 m)   Wt 114.3 kg   SpO2 100%   BMI 46.09 kg/m  Physical Exam Vitals and nursing note reviewed. Exam conducted with a chaperone present.  Constitutional:      Appearance: Normal appearance. She is obese.  HENT:     Head: Normocephalic and atraumatic.  Eyes:     General: No scleral icterus.       Right eye: No discharge.        Left eye: No discharge.     Extraocular Movements: Extraocular movements intact.     Pupils: Pupils are equal, round, and reactive to light.     Comments: Right eye exotropia  Cardiovascular:     Rate and Rhythm: Normal rate and regular rhythm.     Pulses: Normal pulses.     Heart sounds: Normal heart sounds. No murmur heard.    No friction rub. No gallop.  Pulmonary:     Effort: Pulmonary effort is normal. No respiratory distress.     Breath sounds: Normal breath sounds.  Abdominal:     General: Abdomen is flat. Bowel sounds are normal. There is no distension.     Palpations: Abdomen is soft.     Tenderness: There is abdominal tenderness.     Comments: Epigastric tenderness, no Murphy sign  Skin:    General: Skin is warm and dry.     Coloration: Skin is not jaundiced.  Neurological:     Mental Status: She is alert. Mental status is at baseline.     Coordination:  Coordination normal.     ED Results / Procedures / Treatments   Labs (all labs ordered are listed, but only abnormal results are displayed) Labs Reviewed  COMPREHENSIVE METABOLIC PANEL - Abnormal; Notable for the following components:      Result Value   Potassium 3.4 (*)    CO2 19 (*)    Glucose, Bld 115 (*)    Creatinine, Ser 1.01 (*)    All other components within normal limits  CBC - Abnormal; Notable for the following components:   WBC 11.6 (*)    All other components within normal limits  URINALYSIS, ROUTINE W REFLEX MICROSCOPIC - Abnormal; Notable for the following components:   APPearance CLOUDY (*)    Glucose, UA 50 (*)    Hgb urine dipstick SMALL (*)    Ketones, ur 80 (*)    Protein, ur 100 (*)    Leukocytes,Ua MODERATE (*)    WBC, UA >50 (*)    Bacteria, UA RARE (*)    All other components within normal limits  LIPASE, BLOOD  PREGNANCY, URINE    EKG None  Radiology CT Abdomen Pelvis W Contrast  Result Date: 12/06/2021 CLINICAL DATA:  Nausea and vomiting. EXAM: CT ABDOMEN AND PELVIS WITH CONTRAST TECHNIQUE: Multidetector CT imaging of the abdomen and pelvis was performed using the standard protocol following bolus administration of intravenous contrast. RADIATION DOSE REDUCTION: This exam was performed according to the departmental dose-optimization program which includes automated exposure control, adjustment of the mA and/or kV according to patient size and/or use of iterative reconstruction technique. CONTRAST:  OMNIPAQUE IOHEXOL 300 MG/ML  SOLN COMPARISON:  CT dated 08/27/2019. FINDINGS: Lower chest: The visualized lung bases are clear. Slight thickened appearance of the lower thoracic paraspinal tissue (2/2) was seen on the prior study and may represent pleural thickening or development of extra medullary hematopoiesis. Attention on follow-up imaging recommended. No intra-abdominal free air or free fluid. Hepatobiliary: Mild fatty liver. No biliary  dilatation. The gallbladder is unremarkable. Pancreas: Unremarkable. No pancreatic ductal dilatation or surrounding inflammatory changes. Spleen: Normal in size without focal abnormality. Adrenals/Urinary Tract: The adrenal glands unremarkable. The kidneys, visualized ureters, and urinary bladder appear unremarkable. Stomach/Bowel: There is no bowel obstruction or active inflammation. Vascular/Lymphatic: The abdominal aorta and IVC unremarkable. No portal venous gas. There is no adenopathy. Reproductive: The uterus is anteverted and grossly unremarkable. No adnexal masses. Other: Small fat containing umbilical hernia. Musculoskeletal: Mild degenerative changes of the spine. No acute osseous pathology. IMPRESSION: 1. No acute intra-abdominal or pelvic pathology. 2. Mild fatty liver. Electronically Signed   By: Ceasar Mons.D.  On: 12/06/2021 17:38    Procedures Procedures    Medications Ordered in ED Medications  sodium chloride 0.9 % bolus 1,000 mL (0 mLs Intravenous Stopped 12/06/21 1715)  ondansetron (ZOFRAN) injection 4 mg (4 mg Intravenous Given 12/06/21 1609)  HYDROmorphone (DILAUDID) injection 1 mg (1 mg Intravenous Given 12/06/21 1609)  iohexol (OMNIPAQUE) 300 MG/ML solution 100 mL (100 mLs Intravenous Contrast Given 12/06/21 1728)    ED Course/ Medical Decision Making/ A&P                           Medical Decision Making Amount and/or Complexity of Data Reviewed Labs: ordered. Radiology: ordered.  Risk Prescription drug management.   Patient presents due to abdominal pain.  Differential includes not limited to pancreatitis, cholecystitis, gastritis, ectopic pregnancy, UTI, pyelonephritis, nephrolithiasis, dehydration, AKI, metabolic abnormality.  On exam patient has epigastric tenderness.  There is also some left-sided tenderness in the upper quadrant but no rigidity or guarding.  There is no Murphy sign.  She is afebrile, no SIRS criteria and does not appear septic.  No  peritoneal signs.   -BP (!) 143/78   Pulse 71   Temp 98.3 F (36.8 C) (Oral)   Resp 20   Ht 5\' 2"  (1.575 m)   Wt 114.3 kg   SpO2 100%   BMI 46.09 kg/m   I ordered, viewed and interpreted laboratory work-up. CBC with leukocytosis of 11.6. CMP without gross electrolyte derangement, very mild AKI with a creatinine of 1.01. Urine is notable for moderate leukocytes, greater than 50 white blood cells and proteinuria and ketonuria.  Consistent with a UTI. Not pregnant  I ordered and viewed CT abdomen and pelvis.  Negative for any acute process, agree with radiologist interpretation.  I viewed patient on medication list.  I ordered a liter fluid bolus, Zofran, Dilaudid.  Patient's pain improved on reevaluation.  Given no fever, transaminitis or Murphy sign on exam I do not think this is cholecystitis.  No signs of ectopic pregnancy.  CT scan is negative for pancreatitis or any acute emergency.  Also no signs of pyelonephritis.  Will cover UTI with antibiotics, return precautions discussed with the patient who verbalized understanding.        Final Clinical Impression(s) / ED Diagnoses Final diagnoses:  Acute cystitis without hematuria    Rx / DC Orders ED Discharge Orders          Ordered    cephALEXin (KEFLEX) 500 MG capsule  4 times daily        12/06/21 1747    phenazopyridine (PYRIDIUM) 200 MG tablet  3 times daily        12/06/21 1747    ondansetron (ZOFRAN) 4 MG tablet  Every 6 hours        12/06/21 1747              13/08/23, PA-C 12/06/21 1750    Kommor, 13/08/23, MD 12/08/21 0236

## 2021-12-06 NOTE — Discharge Instructions (Signed)
You were seen today in the emergency for abdominal pain.  Your urine is infected, take the antibiotic 4 times daily or take 1000 mg of it twice daily for the next 5 days.  You can take the Zofran as needed for nausea, the Pyridium he can take 3 times daily for the first 2 days to help with the pain.  You also take Tylenol and Motrin at home.  Follow-up with your main doctor for reevaluation.  Return to the ED if you have new or concerning symptoms.

## 2021-12-06 NOTE — ED Triage Notes (Signed)
Pt reports left abdomen pain. Started 3 weeks ago. Pt reports the pain has been constant. Pt has been nauseous and had loose stools.

## 2021-12-11 ENCOUNTER — Encounter (HOSPITAL_COMMUNITY): Payer: Self-pay

## 2021-12-11 ENCOUNTER — Other Ambulatory Visit: Payer: Self-pay

## 2021-12-11 ENCOUNTER — Emergency Department (HOSPITAL_COMMUNITY)
Admission: EM | Admit: 2021-12-11 | Discharge: 2021-12-11 | Disposition: A | Discharge status: Home / Self Care | Payer: Medicare HMO | Attending: Emergency Medicine | Admitting: Emergency Medicine

## 2021-12-11 ENCOUNTER — Emergency Department (HOSPITAL_COMMUNITY): Payer: Medicare HMO

## 2021-12-11 DIAGNOSIS — R1012 Left upper quadrant pain: Secondary | ICD-10-CM | POA: Diagnosis present

## 2021-12-11 DIAGNOSIS — N3001 Acute cystitis with hematuria: Secondary | ICD-10-CM | POA: Insufficient documentation

## 2021-12-11 LAB — CBC
HCT: 40 % (ref 36.0–46.0)
Hemoglobin: 13.3 g/dL (ref 12.0–15.0)
MCH: 31.7 pg (ref 26.0–34.0)
MCHC: 33.3 g/dL (ref 30.0–36.0)
MCV: 95.2 fL (ref 80.0–100.0)
Platelets: 321 10*3/uL (ref 150–400)
RBC: 4.2 MIL/uL (ref 3.87–5.11)
RDW: 13.5 % (ref 11.5–15.5)
WBC: 11.2 10*3/uL — ABNORMAL HIGH (ref 4.0–10.5)
nRBC: 0 % (ref 0.0–0.2)

## 2021-12-11 LAB — URINALYSIS, ROUTINE W REFLEX MICROSCOPIC
Bilirubin Urine: NEGATIVE
Glucose, UA: NEGATIVE mg/dL
Ketones, ur: 20 mg/dL — AB
Nitrite: NEGATIVE
Protein, ur: NEGATIVE mg/dL
Specific Gravity, Urine: 1.017 (ref 1.005–1.030)
pH: 5 (ref 5.0–8.0)

## 2021-12-11 LAB — COMPREHENSIVE METABOLIC PANEL
ALT: 17 U/L (ref 0–44)
AST: 21 U/L (ref 15–41)
Albumin: 4.1 g/dL (ref 3.5–5.0)
Alkaline Phosphatase: 59 U/L (ref 38–126)
Anion gap: 9 (ref 5–15)
BUN: 9 mg/dL (ref 6–20)
CO2: 19 mmol/L — ABNORMAL LOW (ref 22–32)
Calcium: 8.6 mg/dL — ABNORMAL LOW (ref 8.9–10.3)
Chloride: 110 mmol/L (ref 98–111)
Creatinine, Ser: 1 mg/dL (ref 0.44–1.00)
GFR, Estimated: >60 mL/min (ref >60)
Glucose, Bld: 91 mg/dL (ref 70–99)
Potassium: 4.1 mmol/L (ref 3.5–5.1)
Sodium: 138 mmol/L (ref 135–145)
Total Bilirubin: 0.6 mg/dL (ref 0.3–1.2)
Total Protein: 7.5 g/dL (ref 6.5–8.1)

## 2021-12-11 LAB — POC URINE PREG, ED: Preg Test, Ur: NEGATIVE

## 2021-12-11 LAB — LIPASE, BLOOD: Lipase: 27 U/L (ref 11–51)

## 2021-12-11 MED ORDER — IOHEXOL 300 MG/ML  SOLN
100.0000 mL | Freq: Once | INTRAMUSCULAR | Status: AC | PRN
  Administered 2021-12-11: 100 mL via INTRAVENOUS

## 2021-12-11 MED ORDER — PANTOPRAZOLE SODIUM 20 MG PO TBEC: 20.0000 mg | DELAYED_RELEASE_TABLET | Freq: Every day | ORAL | 0 refills | Status: AC

## 2021-12-11 MED ORDER — SUCRALFATE 1 GM/10ML PO SUSP: 1.0000 g | Freq: Three times a day (TID) | ORAL | 0 refills | Status: AC | PRN

## 2021-12-11 MED ORDER — ALUM & MAG HYDROXIDE-SIMETH 200-200-20 MG/5ML PO SUSP
30.0000 mL | Freq: Once | ORAL | Status: AC
  Administered 2021-12-11: 30 mL via ORAL
  Filled 2021-12-11: qty 30

## 2021-12-11 MED ORDER — FAMOTIDINE IN NACL 20-0.9 MG/50ML-% IV SOLN
20.0000 mg | Freq: Once | INTRAVENOUS | Status: AC
  Administered 2021-12-11: 20 mg via INTRAVENOUS
  Filled 2021-12-11: qty 50

## 2021-12-11 MED ORDER — ONDANSETRON HCL 4 MG PO TABS: 4.0000 mg | ORAL_TABLET | Freq: Three times a day (TID) | ORAL | 0 refills | Status: AC | PRN

## 2021-12-11 MED ORDER — SULFAMETHOXAZOLE-TRIMETHOPRIM 800-160 MG PO TABS: 1.0000 | ORAL_TABLET | Freq: Two times a day (BID) | ORAL | 0 refills | Status: AC

## 2021-12-11 MED ORDER — SODIUM CHLORIDE 0.9 % IV BOLUS
1000.0000 mL | Freq: Once | INTRAVENOUS | Status: AC
  Administered 2021-12-11: 1000 mL via INTRAVENOUS

## 2021-12-11 MED ORDER — ONDANSETRON HCL 4 MG/2ML IJ SOLN
4.0000 mg | Freq: Once | INTRAMUSCULAR | Status: AC
  Administered 2021-12-11: 4 mg via INTRAVENOUS
  Filled 2021-12-11: qty 2

## 2021-12-11 NOTE — Discharge Instructions (Addendum)
It was a pleasure taking care of you today!  Your labs did not show any concerning findings today.  Your CT scan did not show any concerning findings today.  It did show concerns for UTI.  You have a urine culture ordered with results pending, you will be called and notified of any medication changes should the urine culture result as such.  You will be sent a prescription for Bactrim, take along with the prescription Keflex for UTI.  You will be sent a prescription for Protonix, Carafate, Zofran, take as directed.  It is important that you decrease consumption of spicy food, chocolate, caffeine.  Also important that you do not lay down within 30 minutes of eating.  Attached is information for the on-call gastroenterologist, you may call and set up a follow-up appointment regarding today's ED visit.  You may follow-up with your primary care provider as needed.  Return to the emergency department for experiencing increasing/worsening symptoms.

## 2021-12-11 NOTE — ED Triage Notes (Signed)
Pt presents to ED with complaints of left abdominal pain started 3 weeks ago. Pt states she has also had nausea and diarrhea

## 2021-12-11 NOTE — ED Provider Notes (Signed)
[facility] EMERGENCY DEPARTMENT Provider Note   CSN: [id number] Arrival date & time: 12/11/21  1038     History Chief Complaint  Patient presents with   Abdominal Pain    Kimberly Gonzales is a 36 y.o. female who presents to the emergency department with concerns for left upper quadrant abdominal pain onset 3 weeks.  Notes that she has been seen previously for the symptoms.  Denies sick contacts.  Has associated nausea, watery nonbloody diarrhea. Does not have her appendix.  Denies chest pain, shortness of breath, urinary symptoms, fever, vaginal bleeding/discharge.  Denies concerns for STI at this time.  The history is provided by the patient. No language interpreter was used.       Home Medications Prior to Admission medications   Medication Sig Start Date End Date Taking? Authorizing Provider  pantoprazole (PROTONIX) 20 MG tablet Take 1 tablet (20 mg total) by mouth daily. 12/11/21  Yes Aiven Kampe A, PA-C  sucralfate (CARAFATE) 1 GM/10ML suspension Take 10 mLs (1 g total) by mouth 3 (three) times daily as needed. 12/11/21  Yes Anorah Trias A, PA-C  sulfamethoxazole-trimethoprim (BACTRIM DS) 800-160 MG tablet Take 1 tablet by mouth 2 (two) times daily for 5 days. 12/11/21 12/16/21 Yes Kyann Heydt A, PA-C  celecoxib (CELEBREX) 200 MG capsule Take 1 capsule (200 mg total) by mouth 2 (two) times daily. 08/27/19   Harris, Abigail, PA-C  cephALEXin (KEFLEX) 500 MG capsule Take 1 capsule (500 mg total) by mouth 4 (four) times daily. 12/06/21   Theron Arista, PA-C  cyclobenzaprine (FLEXERIL) 10 MG tablet Take 0.5-1 tablets (5-10 mg total) by mouth 2 (two) times daily as needed for muscle spasms. 08/27/19   Arthor Captain, PA-C  famotidine (PEPCID) 20 MG tablet Take 1 tablet (20 mg total) by mouth 2 (two) times daily. Patient not taking: Reported on 06/17/2018 05/25/18   Burgess Amor, PA-C  metoCLOPramide (REGLAN) 10 MG tablet Take 1 tablet (10 mg total) by mouth as needed for nausea. Patient  not taking: Reported on 08/27/2019 06/17/18   Rehman, Areeg N, DO  nystatin cream (MYCOSTATIN) Apply 1 application topically 2 (two) times daily. Apply until rash is resolved, up to 2 weeks. Patient not taking: Reported on 08/27/2019 11/21/18   Burgess Amor, PA-C  ondansetron (ZOFRAN ODT) 4 MG disintegrating tablet Take 1 tablet (4 mg total) by mouth every 8 (eight) hours as needed for nausea or vomiting. Patient not taking: Reported on 06/17/2018 05/25/18   Burgess Amor, PA-C  ondansetron (ZOFRAN) 4 MG tablet Take 1 tablet (4 mg total) by mouth every 8 (eight) hours as needed for nausea or vomiting. 12/11/21   Kathi Dohn A, PA-C  phenazopyridine (PYRIDIUM) 200 MG tablet Take 1 tablet (200 mg total) by mouth 3 (three) times daily. 12/06/21   Theron Arista, PA-C  traMADol (ULTRAM) 50 MG tablet Take 1 tablet (50 mg total) by mouth every 6 (six) hours as needed. 08/27/19   Arthor Captain, PA-C      Allergies    Eggs or egg-derived products    Review of Systems   Review of Systems  Gastrointestinal:  Positive for abdominal pain.  All other systems reviewed and are negative.   Physical Exam Updated Vital Signs BP 119/70 (BP Location: Right Arm)   Pulse 72   Temp 98.5 F (36.9 C) (Oral)   Resp 16   Ht 5\' 2"  (1.575 m)   Wt 114.3 kg   SpO2 97%   BMI 46.09 kg/m  Physical Exam  Vitals and nursing note reviewed.  Constitutional:      General: She is not in acute distress.    Appearance: She is not diaphoretic.  HENT:     Head: Normocephalic and atraumatic.     Mouth/Throat:     Pharynx: No oropharyngeal exudate.  Eyes:     General: No scleral icterus.    Conjunctiva/sclera: Conjunctivae normal.  Cardiovascular:     Rate and Rhythm: Normal rate and regular rhythm.     Pulses: Normal pulses.     Heart sounds: Normal heart sounds.  Pulmonary:     Effort: Pulmonary effort is normal. No respiratory distress.     Breath sounds: Normal breath sounds. No wheezing.  Abdominal:     General:  Bowel sounds are normal.     Palpations: Abdomen is soft. There is no mass.     Tenderness: There is generalized abdominal tenderness. There is no right CVA tenderness, left CVA tenderness, guarding or rebound.     Comments: Diffuse abdominal tenderness to palpation.  No CVA tenderness to palpation noted bilaterally.  Musculoskeletal:        General: Normal range of motion.     Cervical back: Normal range of motion and neck supple.  Skin:    General: Skin is warm and dry.  Neurological:     Mental Status: She is alert.  Psychiatric:        Behavior: Behavior normal.     ED Results / Procedures / Treatments   Labs (all labs ordered are listed, but only abnormal results are displayed) Labs Reviewed  COMPREHENSIVE METABOLIC PANEL - Abnormal; Notable for the following components:      Result Value   CO2 19 (*)    Calcium 8.6 (*)    All other components within normal limits  CBC - Abnormal; Notable for the following components:   WBC 11.2 (*)    All other components within normal limits  URINALYSIS, ROUTINE W REFLEX MICROSCOPIC - Abnormal; Notable for the following components:   Color, Urine AMBER (*)    Hgb urine dipstick SMALL (*)    Ketones, ur 20 (*)    Leukocytes,Ua MODERATE (*)    Bacteria, UA RARE (*)    All other components within normal limits  URINE CULTURE  LIPASE, BLOOD  POC URINE PREG, ED    EKG None  Radiology CT ABDOMEN PELVIS W CONTRAST  Result Date: 12/11/2021 CLINICAL DATA:  Pain left upper quadrant EXAM: CT ABDOMEN AND PELVIS WITH CONTRAST TECHNIQUE: Multidetector CT imaging of the abdomen and pelvis was performed using the standard protocol following bolus administration of intravenous contrast. RADIATION DOSE REDUCTION: This exam was performed according to the departmental dose-optimization program which includes automated exposure control, adjustment of the mA and/or kV according to patient size and/or use of iterative reconstruction technique.  CONTRAST:  100mL OMNIPAQUE IOHEXOL 300 MG/ML  SOLN COMPARISON:  12/06/2021 FINDINGS: Lower chest: No acute findings are seen lower lung fields. Hepatobiliary: No focal abnormalities are seen. There is mild fatty infiltration Gallbladder is unremarkable.  There is no dilation of bile ducts. Pancreas: No focal abnormalities are seen. Spleen: Unremarkable. Adrenals/Urinary Tract: Adrenals are not enlarged. There is no hydronephrosis. There are no renal or ureteral stones. Urinary bladder is not distended. There is mild diffuse wall thickening in the bladder which may be due to incomplete distention or cystitis. Stomach/Bowel: Stomach is unremarkable. There is no dilation of small-bowel loops. Appendix is not seen. There is no pericecal inflammation. There  is no significant wall thickening in colon. There is no pericolic stranding. Vascular/Lymphatic: Unremarkable. Reproductive: Unremarkable. Other: There is no ascites or pneumoperitoneum. Small umbilical hernia containing fat is seen. Musculoskeletal: Degenerative changes are noted in the lumbar spine with extrinsic pressure by posterior bony spurs at L3-L4 and L4-L5 levels. IMPRESSION: There is no evidence of intestinal obstruction or pneumoperitoneum. There is no hydronephrosis. There is mild diffuse wall thickening in the anterior aspect of the bladder which may be due to incomplete distention or suggest cystitis. Please correlate with laboratory findings. Fatty liver.  Lumbar spondylosis. Other findings as described in the body of the report. Electronically Signed   By: Ernie Avena M.D.   On: 12/11/2021 13:52    Procedures Procedures    Medications Ordered in ED Medications  sodium chloride 0.9 % bolus 1,000 mL (0 mLs Intravenous Stopped 12/11/21 1423)  ondansetron (ZOFRAN) injection 4 mg (4 mg Intravenous Given 12/11/21 1230)  alum & mag hydroxide-simeth (MAALOX/MYLANTA) 200-200-20 MG/5ML suspension 30 mL (30 mLs Oral Given 12/11/21 1321)   famotidine (PEPCID) IVPB 20 mg premix (0 mg Intravenous Stopped 12/11/21 1422)  iohexol (OMNIPAQUE) 300 MG/ML solution 100 mL (100 mLs Intravenous Contrast Given 12/11/21 1344)    ED Course/ Medical Decision Making/ A&P Clinical Course as of 12/11/21 1715  Mon Dec 11, 2021  1259 Re-evaluated and patient resting comfortably on the stretcher watching TV.  Patient notes that she has been having indigestion after every time that she eats and it feels like a fire sensation.  Does not take her medications for acid reflux routinely.  Discussed with patient that we could obtain an additional CT scan however patient just had a CT scan that was unremarkable.  At this time patient voices that she would like another CT scan.  Discussed with patient that we will obtain another CT scan and if results are unremarkable then patient will need to follow-up with a GI specialist. Pt agreeable. [SB]  1443 Re-evaluated and noted improvement of symptoms with treatment regimen in the ED. Discussed with patient discharge treatment plan. Answered all available questions. Pt appears safe for discharge.  [SB]    Clinical Course User Index [SB] Jaiden Wahab A, PA-C                           Medical Decision Making Amount and/or Complexity of Data Reviewed Labs: ordered. Radiology: ordered.  Risk OTC drugs. Prescription drug management.   Patient presents to the emergency department with concerns for left upper quadrant abdominal pain onset 3 weeks.  Has been evaluated previously for this.  Has been on Keflex for UTI.  Patient afebrile.  On exam patient with diffuse abdominal tenderness to palpation.  No CVA tenderness to palpation noted bilaterally.  Differential diagnosis includes diverticulitis, pancreatitis, appendicitis, cholecystitis, acute cystitis, pyelonephritis, nephrolithiasis.  Labs:  I ordered, and personally interpreted labs.  The pertinent results include:  Lipase unremarkable CBC with slightly  elevated WBC 11.2, overall improved from previous value 5 days ago CMP unremarkable Pregnancy urine negative Urinalysis overall improved from previous value 5 days ago, swelling on hemoglobin, moderate amount of leukocytes, WBC improving  Imaging: I ordered imaging studies including CT abdomen pelvis with I independently visualized and interpreted imaging which showed  There is no evidence of intestinal obstruction or pneumoperitoneum.  There is no hydronephrosis.    There is mild diffuse wall thickening in the anterior aspect of the  bladder which may be due  to incomplete distention or suggest  cystitis. Please correlate with laboratory findings.    Fatty liver.  Lumbar spondylosis.   I agree with the radiologist interpretation  Medications:  I ordered medication including IV fluids, Pepcid, GI cocktail, Zofran for symptom management. Reevaluation of the patient after these medicines and interventions, I reevaluated the patient and found that they have improved I have reviewed the patients home medicines and have made adjustments as needed Tolerated PO challenge in ED with above treatment regimen   Disposition: Presentation suspicious for acute cystitis with hematuria.  Urine culture sent today.  Doubt concerns at this time for appendicitis, cholecystitis, pancreatitis, diverticulitis. After consideration of the diagnostic results and the patients response to treatment, I feel that the patient would benefit from Discharge home.  Patient discharged home with a prescription for Protonix, Carafate, Zofran, Bactrim.  Will provide information for on-call Gastroenterologist. Instructed pt to call and set up follow up appointment regarding todays ED visit with the gastroenterologist. Supportive care measures and strict return precautions discussed with patient at bedside. Pt acknowledges and verbalizes understanding. Pt appears safe for discharge. Follow up as indicated in discharge paperwork.    This chart was dictated using voice recognition software, Dragon. Despite the best efforts of this provider to proofread and correct errors, errors may still occur which can change documentation meaning.  Final Clinical Impression(s) / ED Diagnoses Final diagnoses:  Acute cystitis with hematuria  LUQ abdominal pain    Rx / DC Orders ED Discharge Orders          Ordered    pantoprazole (PROTONIX) 20 MG tablet  Daily        12/11/21 1452    sucralfate (CARAFATE) 1 GM/10ML suspension  3 times daily PRN        12/11/21 1453    sulfamethoxazole-trimethoprim (BACTRIM DS) 800-160 MG tablet  2 times daily        12/11/21 1453    ondansetron (ZOFRAN) 4 MG tablet  Every 8 hours PRN        12/11/21 1453              Ravindra Baranek A, PA-C 12/11/21 1716    Glyn Ade, MD 12/12/21 [phone]

## 2021-12-13 LAB — URINE CULTURE

## 2022-02-13 ENCOUNTER — Other Ambulatory Visit (HOSPITAL_COMMUNITY): Payer: Self-pay | Admitting: Internal Medicine

## 2022-02-13 DIAGNOSIS — R109 Unspecified abdominal pain: Secondary | ICD-10-CM

## 2022-02-13 DIAGNOSIS — Z8742 Personal history of other diseases of the female genital tract: Secondary | ICD-10-CM

## 2022-02-13 DIAGNOSIS — E049 Nontoxic goiter, unspecified: Secondary | ICD-10-CM

## 2022-02-27 ENCOUNTER — Ambulatory Visit (HOSPITAL_COMMUNITY)
Admission: RE | Admit: 2022-02-27 | Discharge: 2022-02-27 | Disposition: A | Discharge status: Home / Self Care | Payer: Medicare HMO | Source: Ambulatory Visit | Attending: Internal Medicine | Admitting: Internal Medicine

## 2022-02-27 DIAGNOSIS — Z8742 Personal history of other diseases of the female genital tract: Secondary | ICD-10-CM | POA: Insufficient documentation

## 2022-02-27 DIAGNOSIS — R109 Unspecified abdominal pain: Secondary | ICD-10-CM | POA: Insufficient documentation

## 2022-02-27 DIAGNOSIS — E049 Nontoxic goiter, unspecified: Secondary | ICD-10-CM | POA: Insufficient documentation

## 2022-08-16 DIAGNOSIS — R6889 Other general symptoms and signs: Secondary | ICD-10-CM | POA: Diagnosis not present

## 2022-09-13 DIAGNOSIS — R6889 Other general symptoms and signs: Secondary | ICD-10-CM | POA: Diagnosis not present

## 2022-09-27 DIAGNOSIS — R6889 Other general symptoms and signs: Secondary | ICD-10-CM | POA: Diagnosis not present

## 2022-10-18 DIAGNOSIS — R6889 Other general symptoms and signs: Secondary | ICD-10-CM | POA: Diagnosis not present

## 2022-10-25 DIAGNOSIS — R6889 Other general symptoms and signs: Secondary | ICD-10-CM | POA: Diagnosis not present

## 2022-11-08 DIAGNOSIS — R6889 Other general symptoms and signs: Secondary | ICD-10-CM | POA: Diagnosis not present

## 2022-11-13 DIAGNOSIS — R6889 Other general symptoms and signs: Secondary | ICD-10-CM | POA: Diagnosis not present

## 2022-11-22 DIAGNOSIS — R6889 Other general symptoms and signs: Secondary | ICD-10-CM | POA: Diagnosis not present

## 2022-12-06 DIAGNOSIS — R6889 Other general symptoms and signs: Secondary | ICD-10-CM | POA: Diagnosis not present
